# Patient Record
Sex: Female | Born: 1975 | Race: White | Hispanic: No | Smoking: Current every day smoker
Health system: Southern US, Community
[De-identification: ages and names within clinical notes are randomized; demographics above are authoritative.]

## PROBLEM LIST (undated history)

## (undated) HISTORY — PX: LASIK: SHX215

---

## 2016-11-07 ENCOUNTER — Other Ambulatory Visit: Payer: Self-pay | Admitting: Obstetrics and Gynecology

## 2016-11-07 DIAGNOSIS — N644 Mastodynia: Secondary | ICD-10-CM

## 2016-11-11 ENCOUNTER — Ambulatory Visit
Admission: RE | Admit: 2016-11-11 | Discharge: 2016-11-11 | Disposition: A | Discharge status: Home / Self Care | Payer: PRIVATE HEALTH INSURANCE | Source: Ambulatory Visit | Attending: Obstetrics and Gynecology | Admitting: Obstetrics and Gynecology

## 2016-11-11 DIAGNOSIS — N644 Mastodynia: Secondary | ICD-10-CM

## 2017-10-22 IMAGING — MG 2D DIGITAL DIAGNOSTIC BILATERAL MAMMOGRAM WITH CAD AND ADJUNCT T
8 of 12 series · 8 of 28 positions shown · non-contrast
Comparison: None

CLINICAL DATA: Patient presents complaining of bilateral breast
pain located in the lateral aspects of each breast. No reported
lumps.

EXAM:
2D DIGITAL DIAGNOSTIC BILATERAL MAMMOGRAM WITH CAD AND ADJUNCT TOMO

[L MLO]
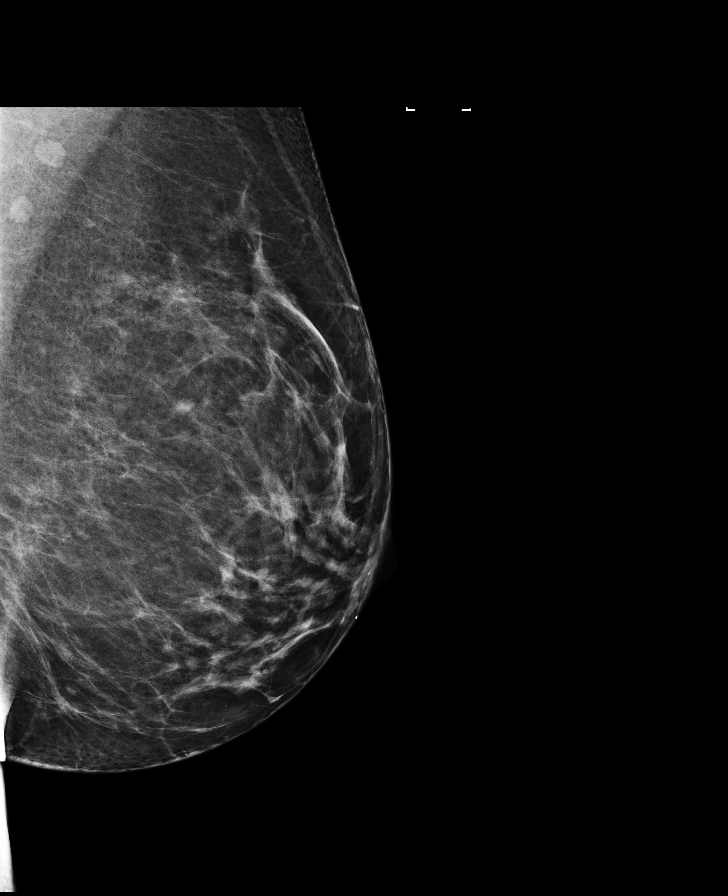

[L MLO synth-2D]
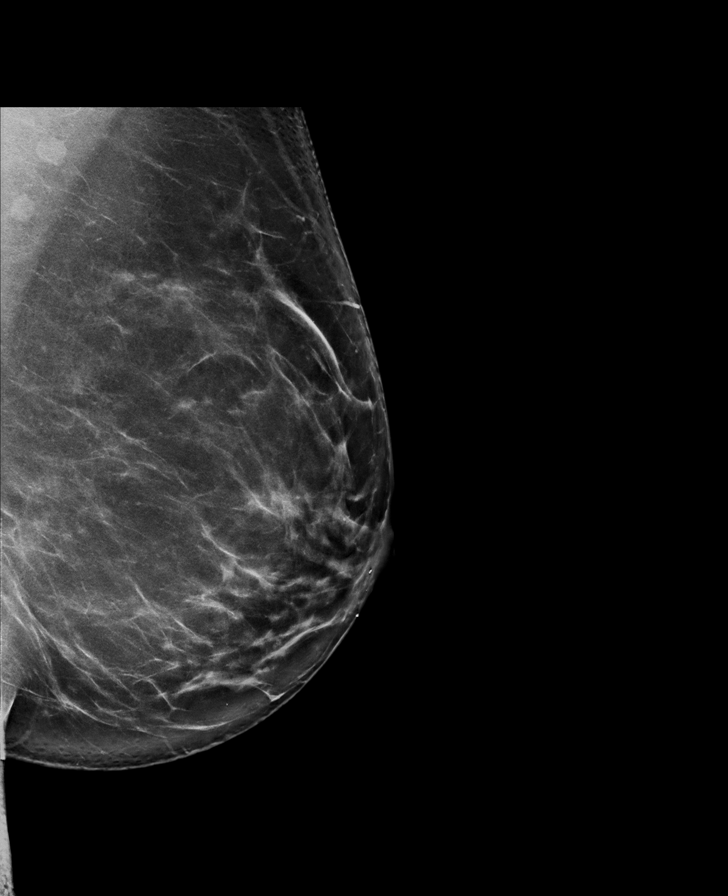

[R MLO]
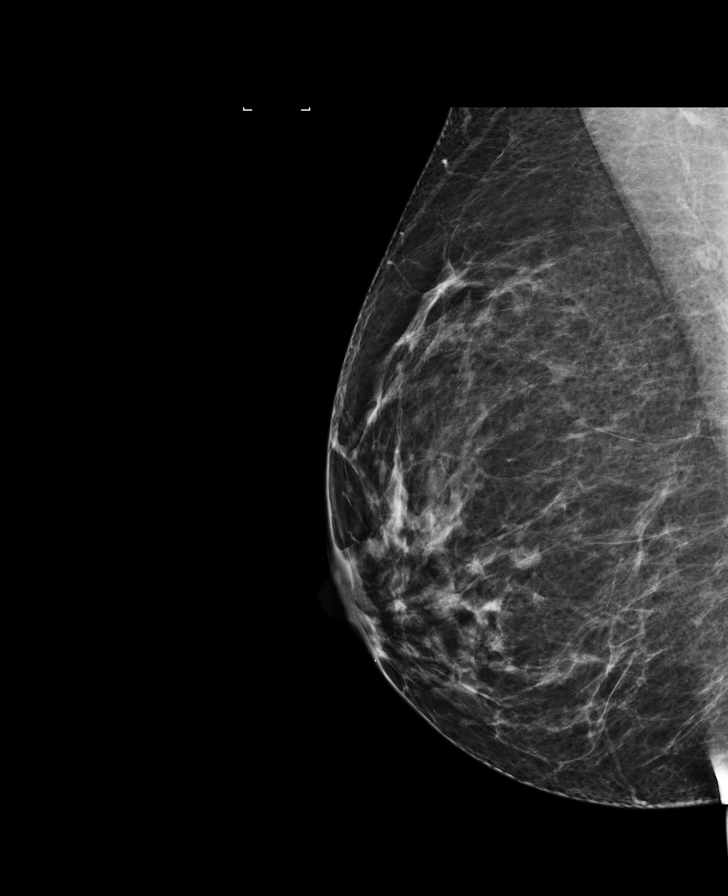

[R CC]
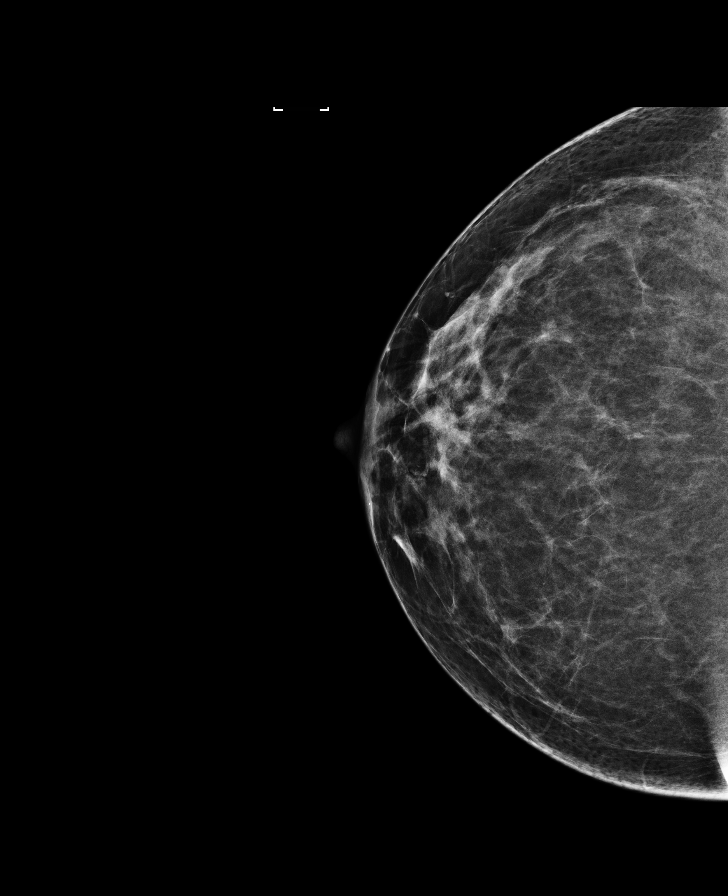

[R MLO synth-2D]
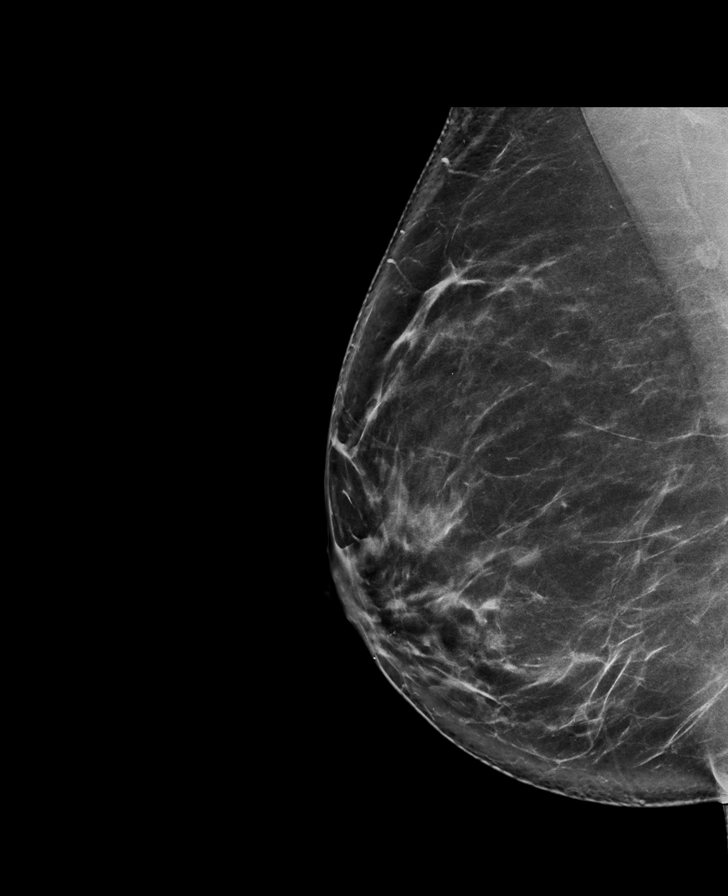

[R CC synth-2D]
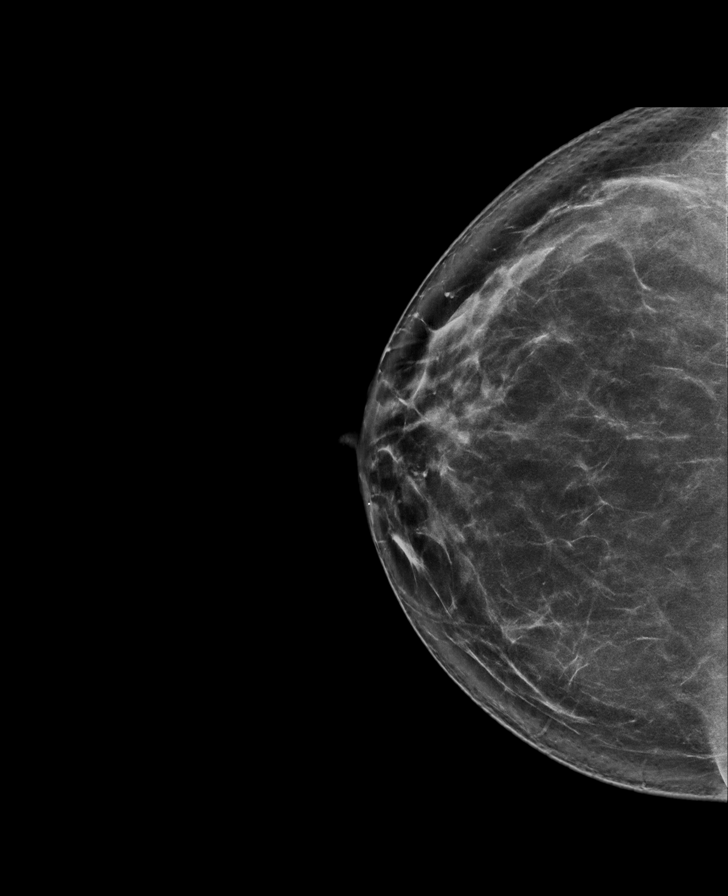

[L CC synth-2D]
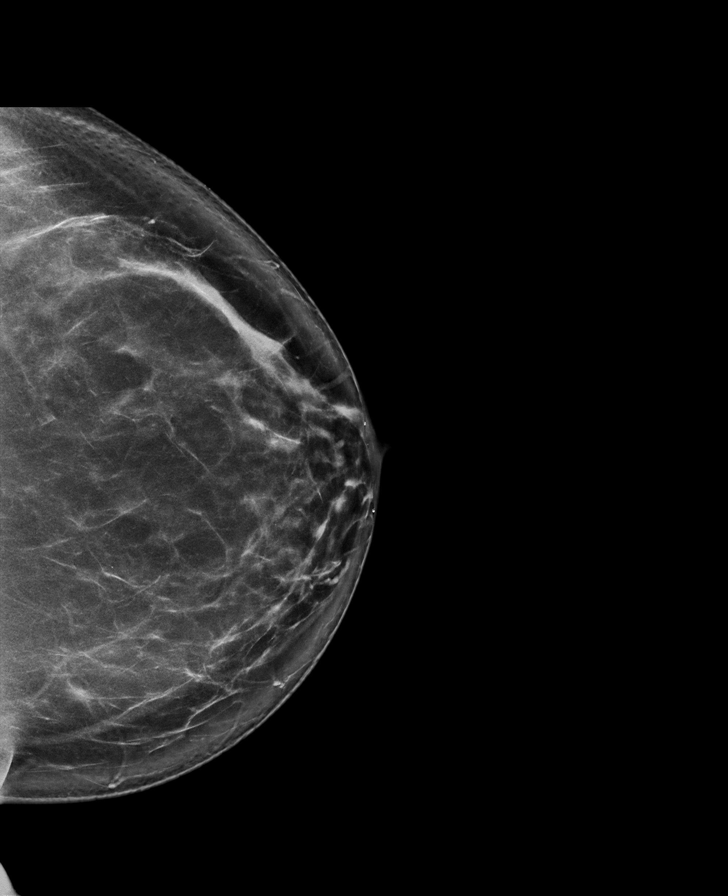

[L CC]
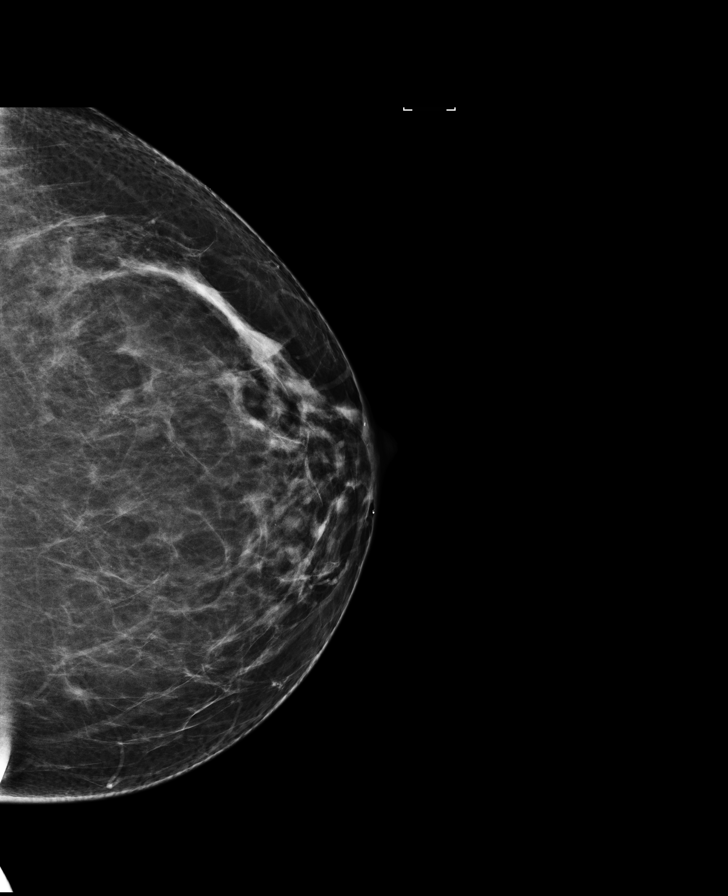

[8 of 28 positions shown; findings below may reference images not displayed]

ACR Breast Density Category b: There are scattered areas of
fibroglandular density.
FINDINGS: There are no discrete masses, areas of architectural distortion,
areas of significant asymmetry or suspicious calcifications.

Mammographic images were processed with CAD.
IMPRESSION: Normal exam.  No evidence of malignancy.

RECOMMENDATION:
Screening mammogram in one year.(Code:[id number])

I have discussed the findings and recommendations with the patient.
Results were also provided in writing at the conclusion of the
visit. If applicable, a reminder letter will be sent to the patient
regarding the next appointment.

BI-RADS CATEGORY  1: Negative.

## 2019-02-19 ENCOUNTER — Emergency Department (HOSPITAL_BASED_OUTPATIENT_CLINIC_OR_DEPARTMENT_OTHER): Payer: PRIVATE HEALTH INSURANCE

## 2019-02-19 ENCOUNTER — Other Ambulatory Visit: Payer: Self-pay

## 2019-02-19 ENCOUNTER — Encounter (HOSPITAL_BASED_OUTPATIENT_CLINIC_OR_DEPARTMENT_OTHER): Payer: Self-pay

## 2019-02-19 ENCOUNTER — Emergency Department (HOSPITAL_BASED_OUTPATIENT_CLINIC_OR_DEPARTMENT_OTHER)
Admission: EM | Admit: 2019-02-19 | Discharge: 2019-02-19 | Disposition: A | Discharge status: Home / Self Care | Payer: PRIVATE HEALTH INSURANCE | Attending: Emergency Medicine | Admitting: Emergency Medicine

## 2019-02-19 DIAGNOSIS — R002 Palpitations: Secondary | ICD-10-CM

## 2019-02-19 DIAGNOSIS — F17210 Nicotine dependence, cigarettes, uncomplicated: Secondary | ICD-10-CM | POA: Diagnosis not present

## 2019-02-19 DIAGNOSIS — R0602 Shortness of breath: Secondary | ICD-10-CM

## 2019-02-19 DIAGNOSIS — R079 Chest pain, unspecified: Secondary | ICD-10-CM | POA: Diagnosis present

## 2019-02-19 LAB — CBC
HCT: 45.5 % (ref 36.0–46.0)
Hemoglobin: 14.8 g/dL (ref 12.0–15.0)
MCH: 28.6 pg (ref 26.0–34.0)
MCHC: 32.5 g/dL (ref 30.0–36.0)
MCV: 87.8 fL (ref 80.0–100.0)
Platelets: 250 10*3/uL (ref 150–400)
RBC: 5.18 MIL/uL — ABNORMAL HIGH (ref 3.87–5.11)
RDW: 12.1 % (ref 11.5–15.5)
WBC: 11.3 10*3/uL — ABNORMAL HIGH (ref 4.0–10.5)
nRBC: 0 % (ref 0.0–0.2)

## 2019-02-19 LAB — BASIC METABOLIC PANEL
Anion gap: 10 (ref 5–15)
BUN: 13 mg/dL (ref 6–20)
CO2: 23 mmol/L (ref 22–32)
Calcium: 8.8 mg/dL — ABNORMAL LOW (ref 8.9–10.3)
Chloride: 105 mmol/L (ref 98–111)
Creatinine, Ser: 0.66 mg/dL (ref 0.44–1.00)
GFR calc Af Amer: >60 mL/min (ref >60)
GFR calc non Af Amer: >60 mL/min (ref >60)
Glucose, Bld: 96 mg/dL (ref 70–99)
Potassium: 3.5 mmol/L (ref 3.5–5.1)
Sodium: 138 mmol/L (ref 135–145)

## 2019-02-19 LAB — TROPONIN I (HIGH SENSITIVITY): Troponin I (High Sensitivity): 2 ng/L (ref <18)

## 2019-02-19 LAB — PREGNANCY, URINE: Preg Test, Ur: NEGATIVE

## 2019-02-19 LAB — D-DIMER, QUANTITATIVE: D-Dimer, Quant: <0.27 ug/mL-FEU (ref 0.00–0.50)

## 2019-02-19 MED ORDER — SODIUM CHLORIDE 0.9% FLUSH
3.0000 mL | Freq: Once | INTRAVENOUS | Status: AC
  Administered 2019-02-19: 21:00:00 3 mL via INTRAVENOUS
  Filled 2019-02-19: qty 3

## 2019-02-19 MED ORDER — ALBUTEROL SULFATE HFA 108 (90 BASE) MCG/ACT IN AERS
2.0000 | INHALATION_SPRAY | RESPIRATORY_TRACT | Status: DC | PRN
  Administered 2019-02-19 (×2): 2 via RESPIRATORY_TRACT
  Filled 2019-02-19: qty 6.7

## 2019-02-19 NOTE — Discharge Instructions (Signed)
Try the inhaler for the shortness of breath.  If you continue to have the palpitations it is a good idea to follow up for heart monitor.

## 2019-02-19 NOTE — ED Provider Notes (Signed)
[facility] EMERGENCY DEPARTMENT Provider Note   CSN: [id number] Arrival date & time: 02/19/19  2020     History   Chief Complaint Chief Complaint  Patient presents with  . Chest Pain    HPI Caitlin Anderson is a 43 y.o. female.     Patient is a 43 year old female with a history of tobacco abuse presenting today with 2 to 3 weeks of worsening exertional dyspnea, occasional chest palpitations and intermittent pleuritic chest pain.  Patient states that since [place] she has been working from home and sits for prolonged periods of time in front of the computer for work sometimes for up to 12 to 14 hours a day.  She has not had cough, congestion, fever, abdominal pain, nausea or vomiting.  She states since her 21s she has had issues with palpitations but has never been able to afford the monitoring for checking and it happens so infrequently she did not think it would ever be able to be caught.  However in the last 3 weeks she has had more episodes but has never experienced the shortness of breath and fatigue like she has had more recently.  She denies any unilateral leg pain or swelling.  Her grandmother did have blood clots and multiple family members with CHF, MIs and cardiomyopathy.  She has not seen a doctor in quite some time because she has poor insurance.  Does not take any medications at this time.  The history is provided by the patient.  Chest Pain   History reviewed. No pertinent past medical history.  There are no active problems to display for this patient.   Past Surgical History:  Procedure Laterality Date  . LASIK       OB History   No obstetric history on file.      Home Medications    Prior to Admission medications   Not on File    Family History No family history on file.  Social History Social History   Tobacco Use  . Smoking status: Current Every Day Smoker    Types: Cigarettes  . Smokeless tobacco: Never Used  Substance Use Topics  .  Alcohol use: Never    Frequency: Never  . Drug use: Never     Allergies   Patient has no known allergies.   Review of Systems Review of Systems  Cardiovascular: Positive for chest pain.  All other systems reviewed and are negative.    Physical Exam Updated Vital Signs BP 136/78   Pulse 84   Resp 17   SpO2 99%   Physical Exam Vitals signs and nursing note reviewed.  Constitutional:      General: She is not in acute distress.    Appearance: She is well-developed and normal weight.  HENT:     Head: Normocephalic and atraumatic.     Mouth/Throat:     Mouth: Mucous membranes are moist.  Eyes:     Pupils: Pupils are equal, round, and reactive to light.  Cardiovascular:     Rate and Rhythm: Normal rate and regular rhythm.  Extrasystoles are present.    Heart sounds: Normal heart sounds. No murmur. No friction rub.  Pulmonary:     Effort: Pulmonary effort is normal.     Breath sounds: Normal breath sounds. No wheezing or rales.  Abdominal:     General: Bowel sounds are normal. There is no distension.     Palpations: Abdomen is soft.     Tenderness: There is no abdominal  tenderness. There is no guarding or rebound.  Musculoskeletal: Normal range of motion.        General: No tenderness.     Comments: No edema  Skin:    General: Skin is warm and dry.     Capillary Refill: Capillary refill takes less than 2 seconds.     Findings: No rash.  Neurological:     Mental Status: She is alert and oriented to person, place, and time. Mental status is at baseline.     Cranial Nerves: No cranial nerve deficit.  Psychiatric:        Mood and Affect: Mood normal.        Behavior: Behavior normal.        Thought Content: Thought content normal.      ED Treatments / Results  Labs (all labs ordered are listed, but only abnormal results are displayed) Labs Reviewed  BASIC METABOLIC PANEL - Abnormal; Notable for the following components:      Result Value   Calcium 8.8 (*)     All other components within normal limits  CBC - Abnormal; Notable for the following components:   WBC 11.3 (*)    RBC 5.18 (*)    All other components within normal limits  PREGNANCY, URINE  D-DIMER, QUANTITATIVE (NOT [facility])  TROPONIN I (HIGH SENSITIVITY)  TROPONIN I (HIGH SENSITIVITY)    EKG EKG Interpretation  Date/Time:  Friday February 19 2019 [id number]:23 EDT Ventricular Rate:  73 PR Interval:  116 QRS Duration: 82 QT Interval:  402 QTC Calculation: 442 R Axis:   61 Text Interpretation:  Sinus rhythm with occasional Premature ventricular complexes Cannot rule out Anterior infarct , age undetermined No previous tracing Confirmed by Gwyneth SproutPlunkett, Ellieanna Funderburg ([id number]) on 02/19/2019 10:05:51 PM   Radiology Dg Chest 2 View  Result Date: 02/19/2019 CLINICAL DATA:  Chest pain. Progressive shortness of breath. EXAM: CHEST - 2 VIEW COMPARISON:  None. FINDINGS: The cardiomediastinal contours are normal. The lungs are clear. Pulmonary vasculature is normal. No consolidation, pleural effusion, or pneumothorax. No acute osseous abnormalities are seen. IMPRESSION: Negative radiographs of the chest. Electronically Signed   By: Narda RutherfordMelanie  Sanford M.D.   On: 02/19/2019 20:56    Procedures Procedures (including critical care time)  Medications Ordered in ED Medications  albuterol (VENTOLIN HFA) 108 (90 Base) MCG/ACT inhaler 2 puff (2 puffs Inhalation Given 02/19/19 2332)  sodium chloride flush (NS) 0.9 % injection 3 mL (3 mLs Intravenous Given 02/19/19 2119)     Initial Impression / Assessment and Plan / ED Course  I have reviewed the triage vital signs and the nursing notes.  Pertinent labs & imaging results that were available during my care of the patient were reviewed by me and considered in my medical decision making (see chart for details).        [age] year old female presenting today with symptoms of exertional dyspnea and fatigue and intermittent pleuritic chest discomfort.  Patient's EKG  today is within normal limits x2 except for some PVCs.  Troponin is 2 and symptoms have been ongoing for several weeks and feel that that rules her out for ACS.  Based on patient's symptoms and inactivity d-dimer was done but was within normal limits and low suspicion at this point for PE.  Low suspicion for dissection or an abdominal pathology.  Patient has been a smoker for a long period of time and concerned that possibly some of her symptoms could be related to COPD and has not been diagnosed.  Patient has no dysrhythmia at this time but does have occasional PVCs.  Patient given an albuterol inhaler to use to see if that helps with her exertional dyspnea.  Chest x-ray, CBC, CMP, d-dimer and troponin are all within normal limits.  Recommended that patient establish care with a regular doctor as she may need PFTs in the future as well as an echo to ensure no valvular abnormalities.  No murmur was noted on exam.  Findings were discussed with the patient and her sister.  They were given return precautions.  She is also going to look into getting the app on her phone that can do the EKG since she is having more episodes of palpitations.  Final Clinical Impressions(s) / ED Diagnoses   Final diagnoses:  SOB (shortness of breath)  Palpitation    ED Discharge Orders    None       Gwyneth Sprout, MD 02/19/19 2352

## 2019-02-19 NOTE — ED Triage Notes (Addendum)
Pt CP, SOB x 3 weeks-worse since 6pm-denies CP at present-states main concern is SOB that is worse with activity-denies fever/flu like sx-NAD-steady gait

## 2020-01-30 IMAGING — CR DG CHEST 2V
2 series · 2 of 2 positions shown · non-contrast
Comparison: None.

CLINICAL DATA: Chest pain. Progressive shortness of breath.

EXAM:
CHEST - 2 VIEW

[w chest pa]
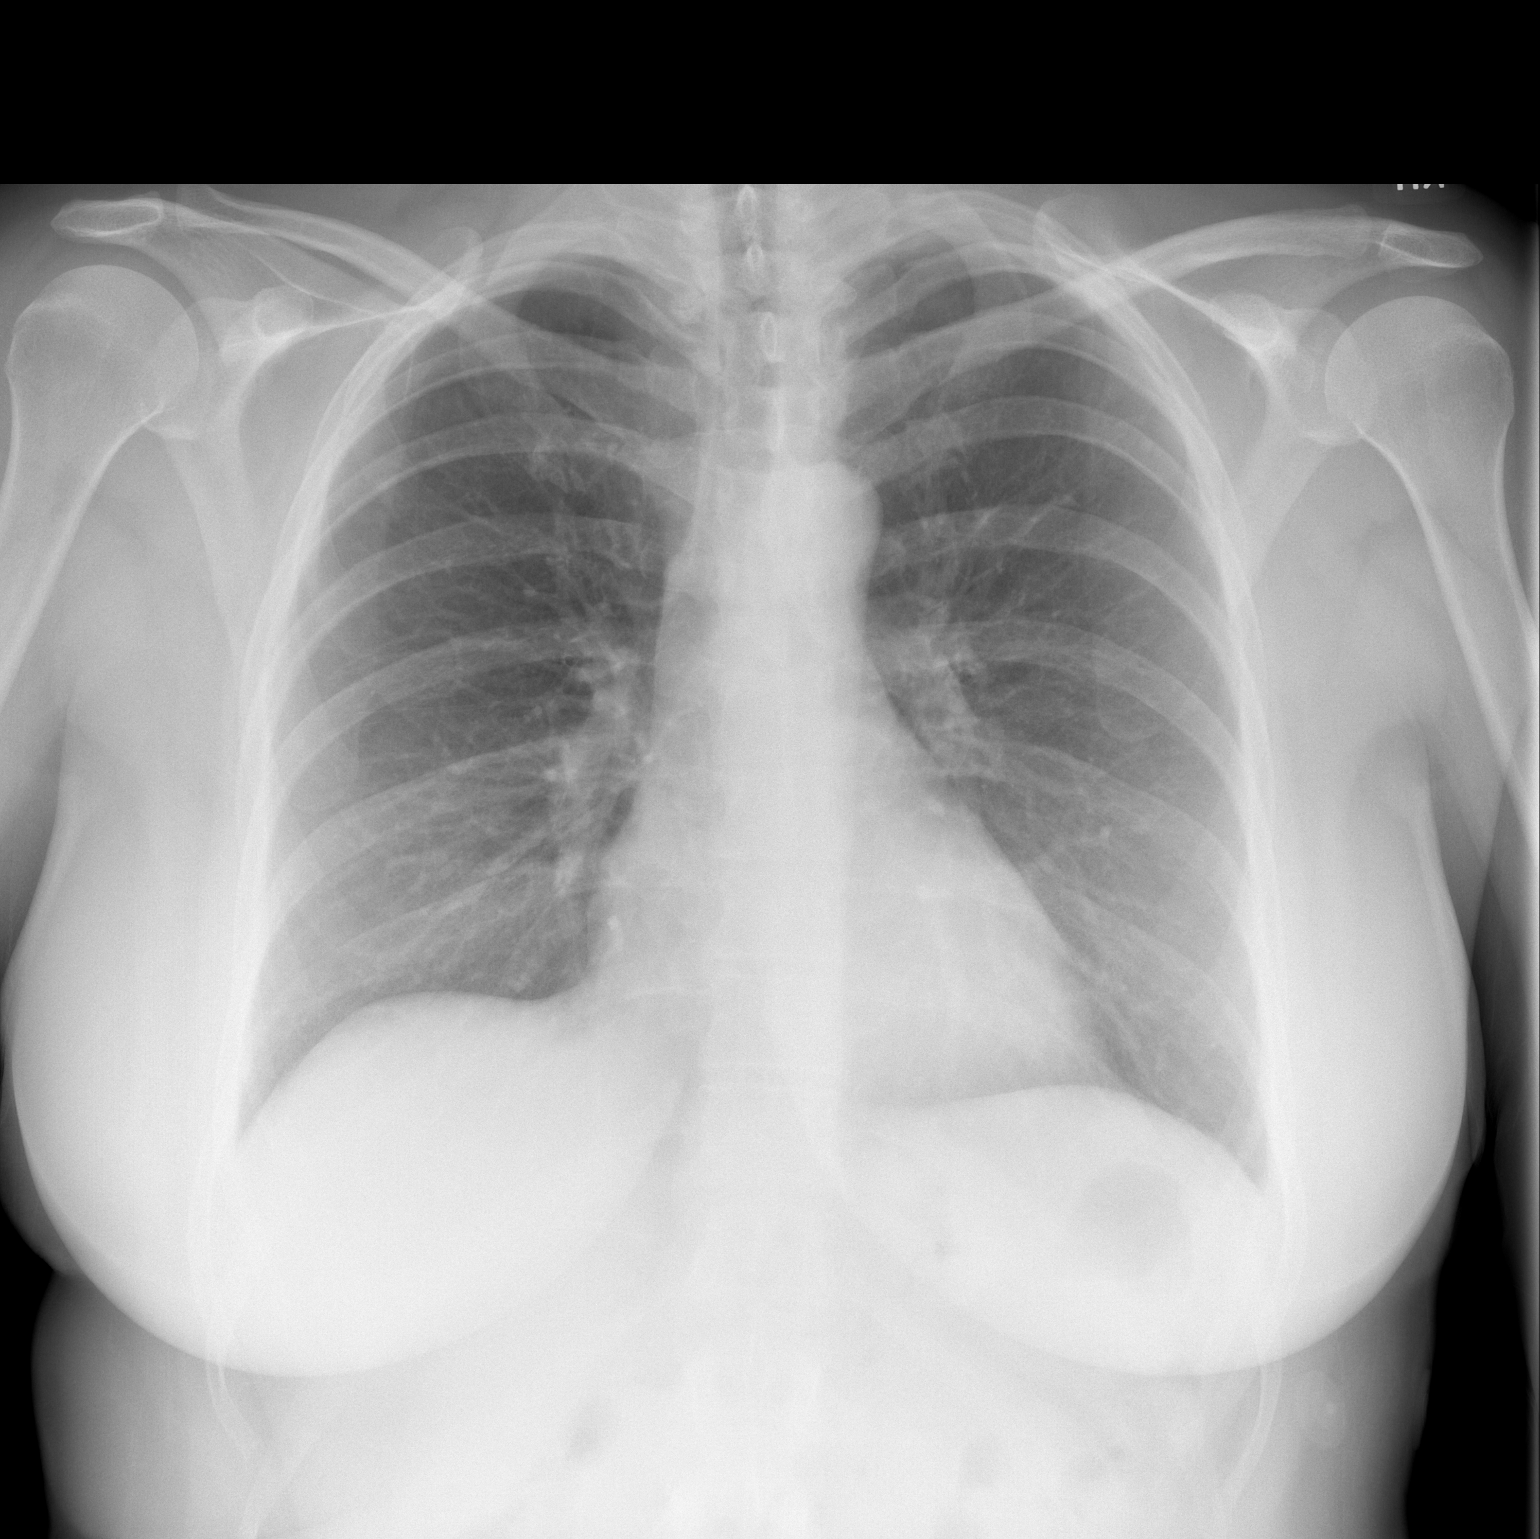

[w chest lat]
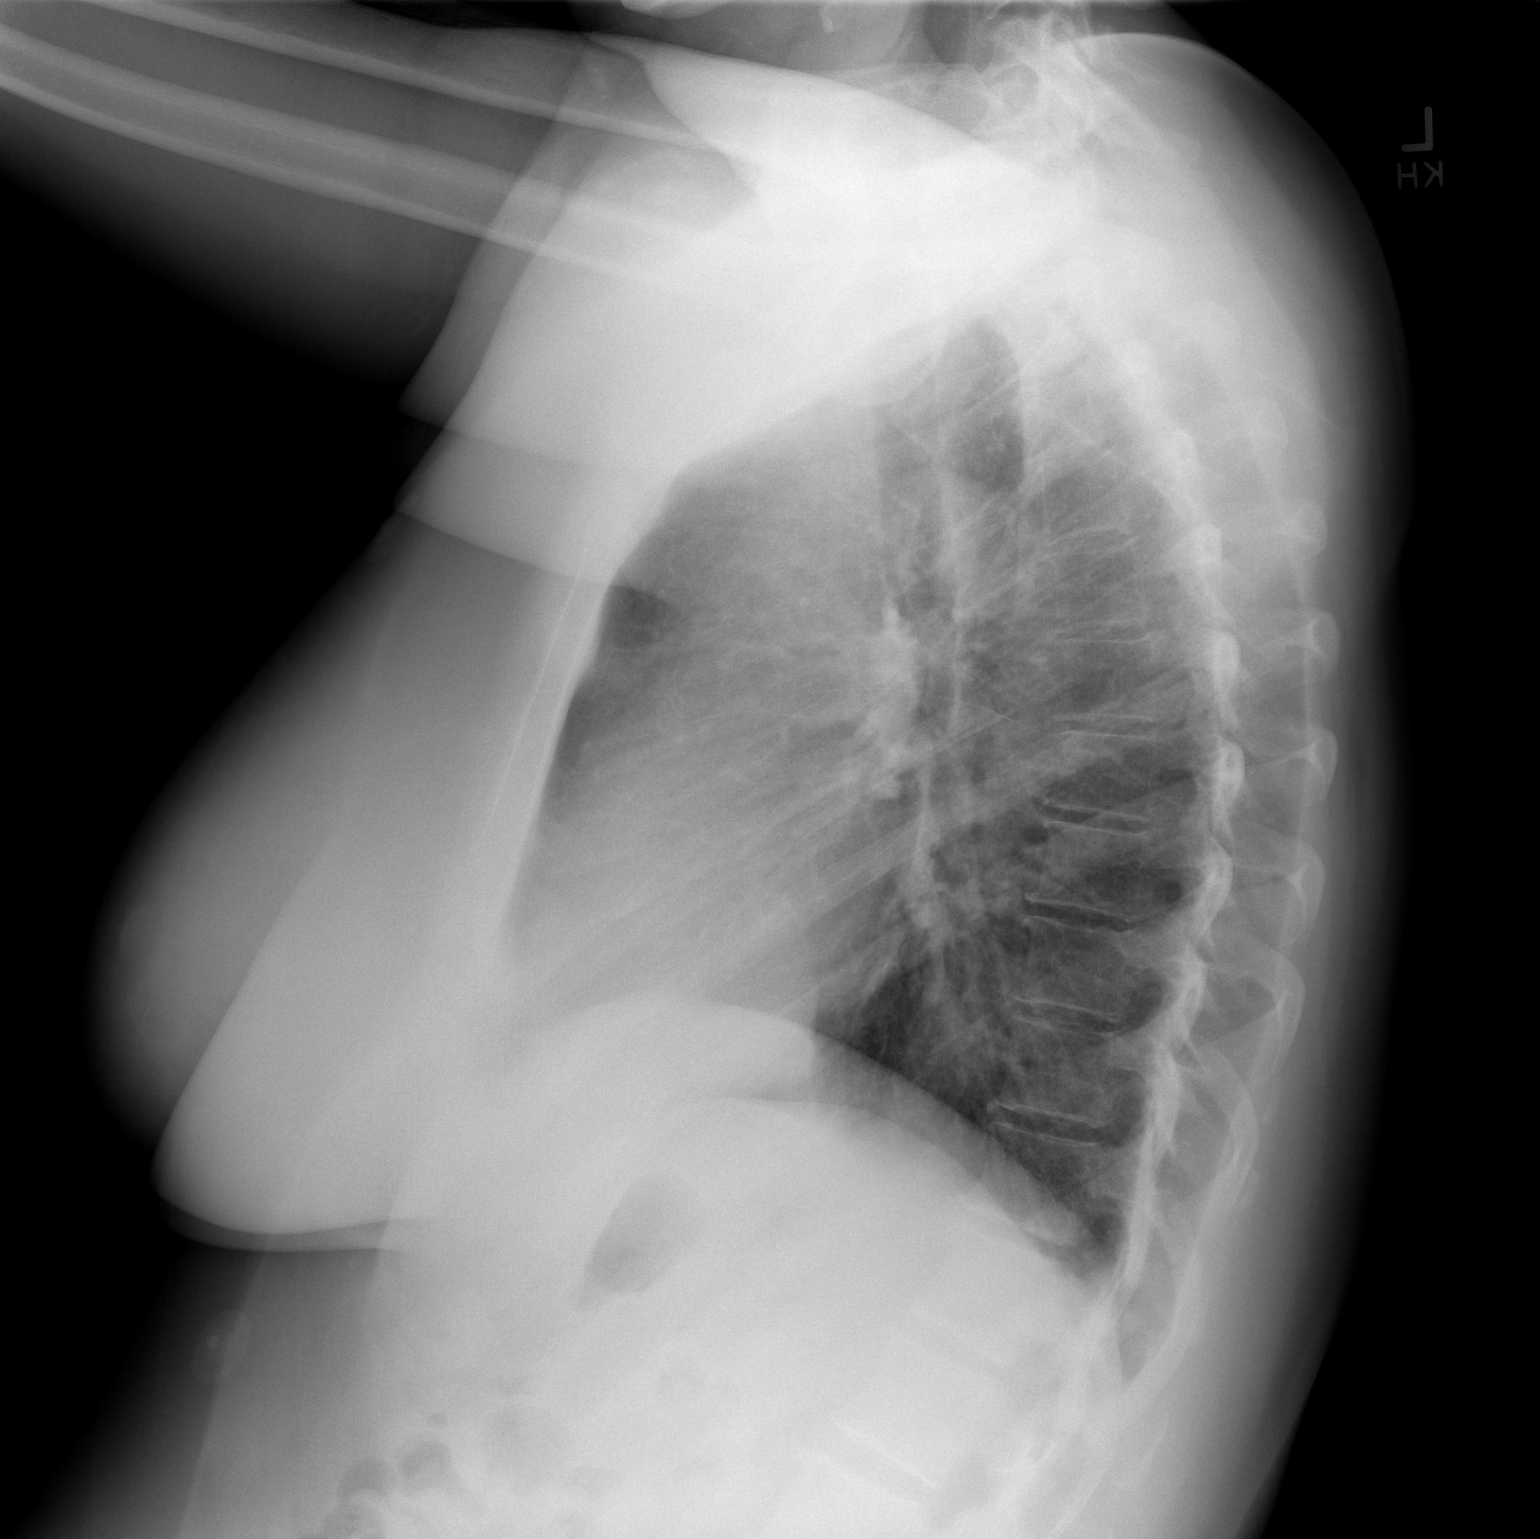

[2 of 2 positions shown; findings below may reference images not displayed]

FINDINGS: The cardiomediastinal contours are normal. The lungs are clear.
Pulmonary vasculature is normal. No consolidation, pleural effusion,
or pneumothorax. No acute osseous abnormalities are seen.
IMPRESSION: Negative radiographs of the chest.

## 2022-04-26 ENCOUNTER — Ambulatory Visit: Payer: PRIVATE HEALTH INSURANCE | Admitting: Obstetrics and Gynecology

## 2022-12-24 ENCOUNTER — Ambulatory Visit: Payer: 59 | Admitting: Sports Medicine

## 2022-12-24 ENCOUNTER — Encounter: Payer: Self-pay | Admitting: Sports Medicine

## 2022-12-24 VITALS — BP 114/80 | HR 90 | Ht 65.0 in | Wt 245.0 lb

## 2022-12-24 DIAGNOSIS — M79672 Pain in left foot: Secondary | ICD-10-CM | POA: Diagnosis not present

## 2022-12-24 DIAGNOSIS — R269 Unspecified abnormalities of gait and mobility: Secondary | ICD-10-CM

## 2022-12-24 DIAGNOSIS — M79671 Pain in right foot: Secondary | ICD-10-CM | POA: Diagnosis not present

## 2022-12-24 NOTE — Progress Notes (Signed)
PCP: Patient, No Pcp Per  Subjective:   HPI: Patient is a 47 y.o. female here for bilateral foot pain.  Patient states that she attempted losing some weight in November of last year and tried to go on more walks.  Patient states that when she started going on the walks she started developing pain in her feet, more so on the left but also on the right.  Patient states that the pain on the left is located in the lateral foot.  Patient denies any trauma to the area.  Patient states that she had an A1c done recently which was within normal limits.  Patient states that she mainly wears [employer], flip-flops and goes bare feet because her feet are always warm.  Patient states that she wears out the lateral parts of her flip-flops often.  Patient also notes some pain near the lateral malleolus of the left foot.  Patient states that the pain on the right side usually occurs at the sole of her foot near her arch.  Patient otherwise has no other concerns at this time..       Objective:  Physical Exam:  Gen: NAD, comfortable in exam room  Ankle/Foot, Bilateral: There is visible erythema of the dorsal side of the left foot, no swelling, ecchymosis, or bony deformity noted. No notable pes cavus/pes planus deformity. Transverse arch grossly intact; Normal eversion/inversion stress testing. Range of motion is full in all directions. Strength is 5/5 in all directions. No tenderness at the insertion/body/myotendinous junction of the Achilles tendon; No peroneal tendon tenderness or subluxation; Mild tenderness on anterior aspect of lateral left malleolus, no TTP over medial malleolus;  No plantar calcaneal tenderness; There is tenderness over the base of the 5th MT; No tenderness at the distal metatarsals; Able to walk 4 steps. There is noted supination on bilateral feet during ambulation.   Provocative Testing:   - Anterior Drawer: NEG  - Talar Tilt: NEG  - Calcaneal Squeeze Test: NEG     Assessment & Plan:   1. 1. Bilateral foot pain Patient with bilateral foot pain left more pronounced than the right, given patient's physical exam and abnormal gait patient symptoms are likely related to the supination of the bilateral feet when ambulating.  Patient would benefit from lateral heel wedges. Patient was given sports insoles and lateral heel wedges were placed in both feet.  Patient continued to have some lateral foot pain near the base of the fifth metatarsal at which point a fifth ray post was placed.  Patient reported benefit and comfort with new insoles.  Advised patient to start ambulating as tolerated and if there is still no improvement to follow back up in sports medicine clinic.  2. Abnormality of gait  Patient seen and evaluated with the sports medicine fellow.  I agree with the above plan of care.  Green sports insoles with bilateral lateral heel wedges were provided.  We also added 1/5 ray post on the left.  Follow-up as needed.
